# Patient Record
Sex: Female | Born: 1995 | Race: White | Hispanic: No | Marital: Single | Smoking: Never smoker
Health system: Southern US, Community
[De-identification: ages and names within clinical notes are randomized; demographics above are authoritative.]

## PROBLEM LIST (undated history)

## (undated) DIAGNOSIS — T7840XA Allergy, unspecified, initial encounter: Secondary | ICD-10-CM

## (undated) HISTORY — DX: Allergy, unspecified, initial encounter: T78.40XA

---
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
## 2017-07-21 IMAGING — US US ABDOMEN LIMITED
1 series · 14 of 25 positions shown · non-contrast
Comparison: None.

CLINICAL DATA: Upper abdominal pain with nausea and vomiting

EXAM:
ULTRASOUND ABDOMEN LIMITED RIGHT UPPER QUADRANT

[Series 1: us abdomen limited · 0.19mm/px · 14 of 42 slices shown]
[im 1/42]
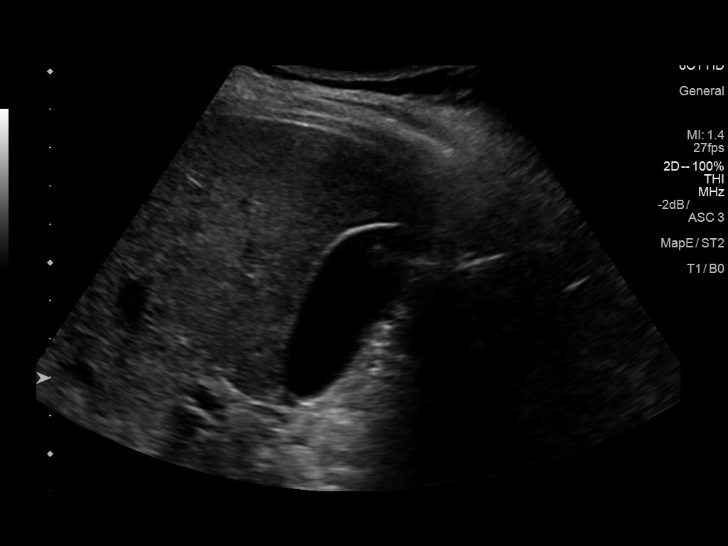
[im 4/42]
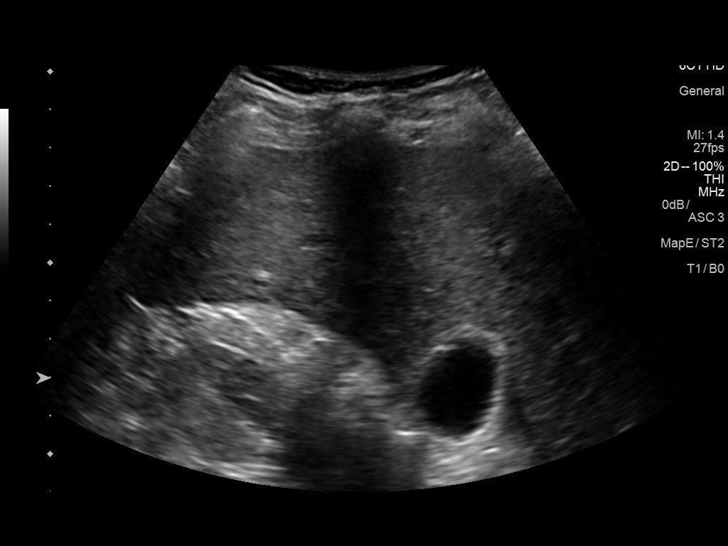
[im 7/42]
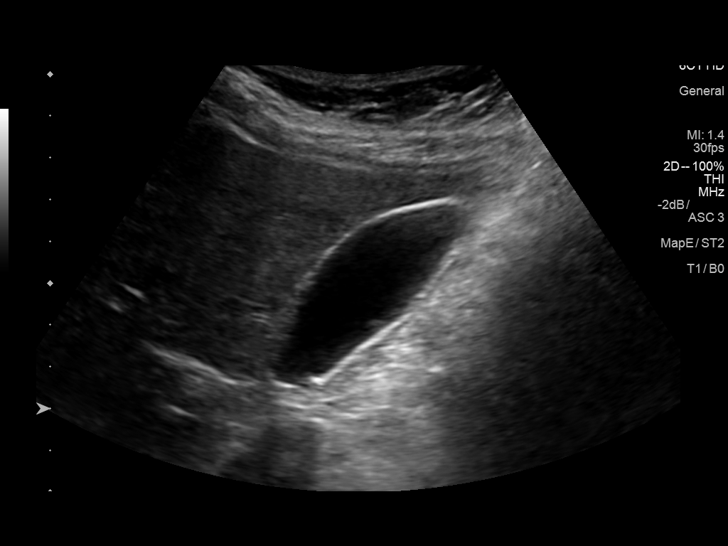
[im 11/42]
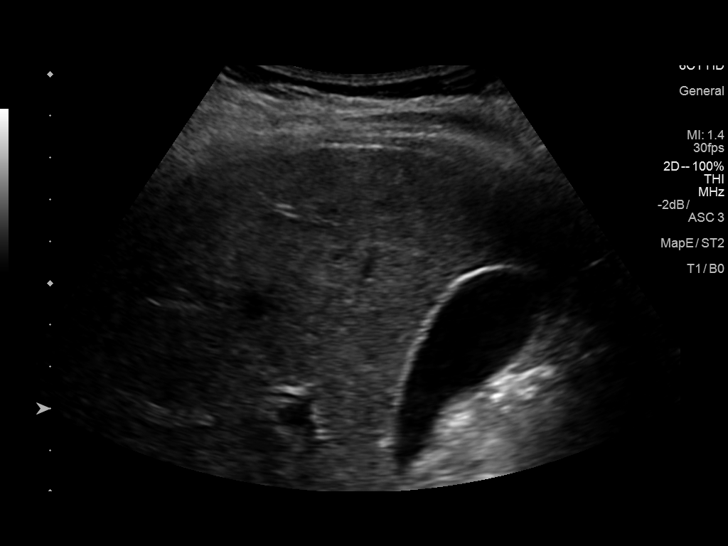
[im 14/42]
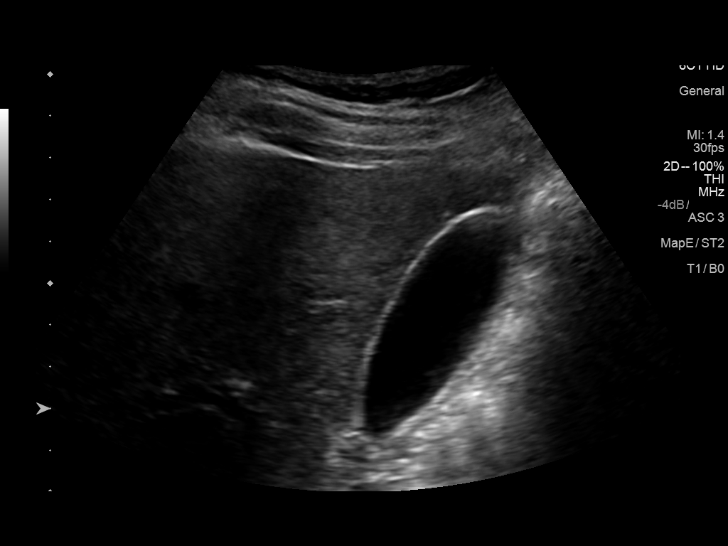
[im 16/42]
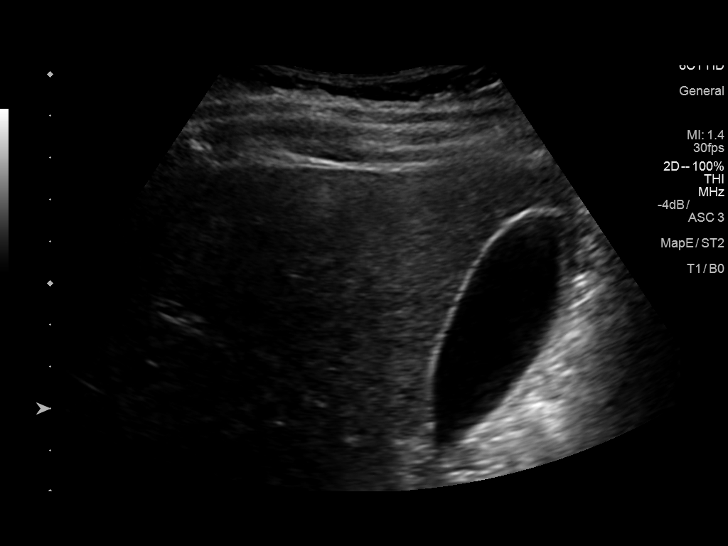
[im 19/42]
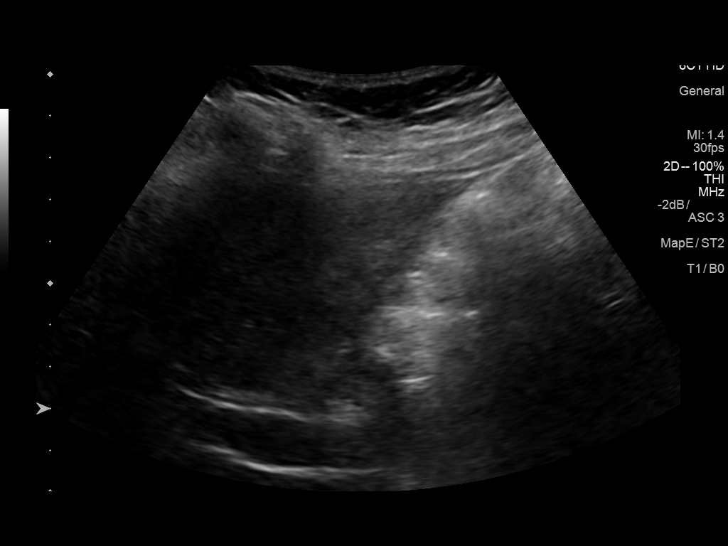
[im 23/42]
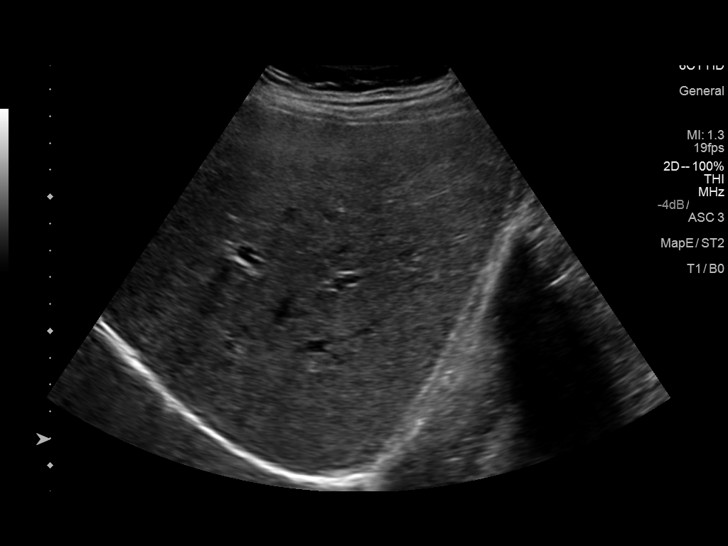
[im 26/42]
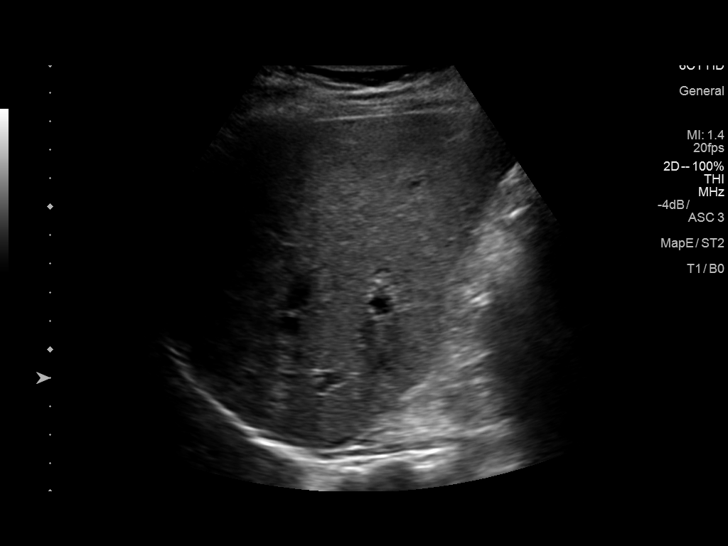
[im 28/42]
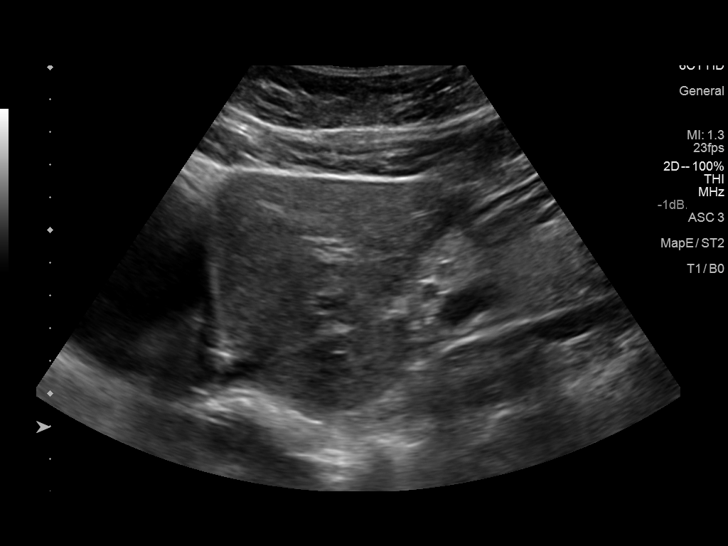
[im 31/42]
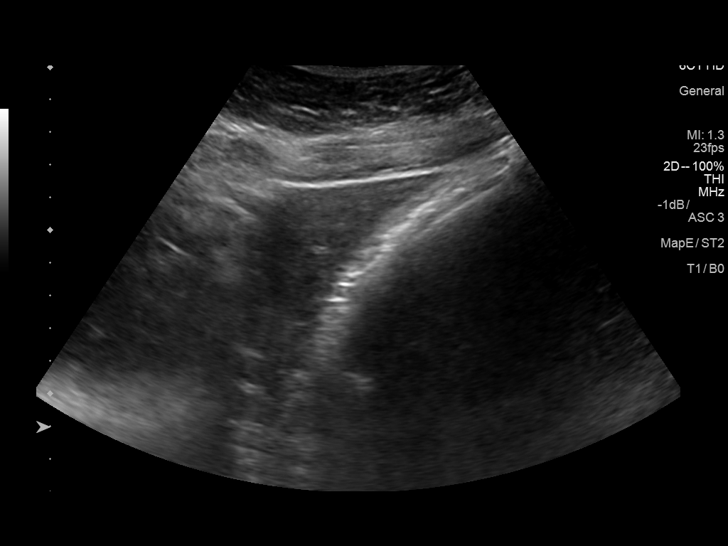
[im 35/42]
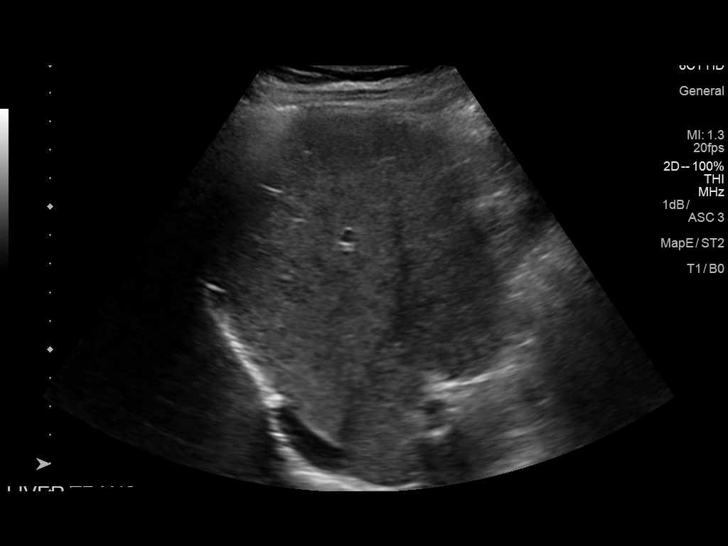
[im 38/42]
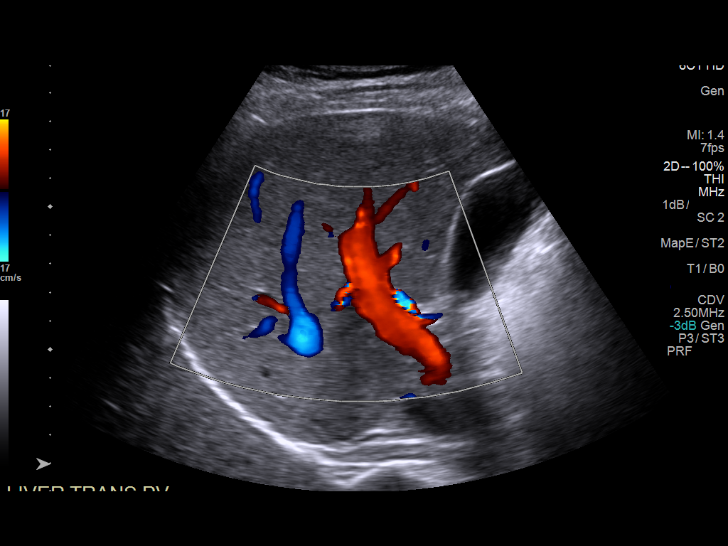
[im 42/42]
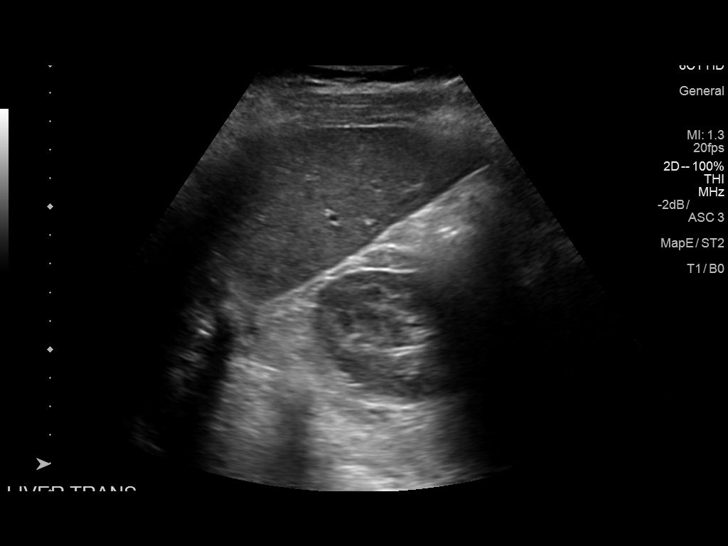

[14 of 25 positions shown; findings below may reference images not displayed]

FINDINGS: Gallbladder:

No gallstones or wall thickening visualized. There is no
pericholecystic fluid. No sonographic Murphy sign noted by
sonographer.

Common bile duct:

Diameter: 2 mm. No intrahepatic or extrahepatic biliary duct
dilatation.

Liver:

No focal lesion identified. Within normal limits in parenchymal
echogenicity. Portal vein is patent on color Doppler imaging with
normal direction of blood flow towards the liver.
IMPRESSION: Study within normal limits.

## 2018-06-26 ENCOUNTER — Other Ambulatory Visit: Payer: Self-pay | Admitting: Gastroenterology

## 2018-06-26 DIAGNOSIS — R112 Nausea with vomiting, unspecified: Secondary | ICD-10-CM

## 2018-07-05 ENCOUNTER — Ambulatory Visit
Admission: RE | Admit: 2018-07-05 | Discharge: 2018-07-05 | Disposition: A | Discharge status: Home / Self Care | Payer: BLUE CROSS/BLUE SHIELD | Source: Ambulatory Visit | Attending: Gastroenterology | Admitting: Gastroenterology

## 2018-07-05 DIAGNOSIS — R112 Nausea with vomiting, unspecified: Secondary | ICD-10-CM

## 2020-02-11 ENCOUNTER — Other Ambulatory Visit: Payer: Self-pay

## 2020-02-11 ENCOUNTER — Encounter: Payer: Self-pay | Admitting: Physician Assistant

## 2020-02-11 ENCOUNTER — Ambulatory Visit (INDEPENDENT_AMBULATORY_CARE_PROVIDER_SITE_OTHER): Payer: 59 | Admitting: Physician Assistant

## 2020-02-11 VITALS — BP 110/76 | HR 91 | Temp 97.6°F | Ht 62.0 in | Wt 222.0 lb

## 2020-02-11 DIAGNOSIS — L814 Other melanin hyperpigmentation: Secondary | ICD-10-CM | POA: Diagnosis not present

## 2020-02-11 DIAGNOSIS — L80 Vitiligo: Secondary | ICD-10-CM | POA: Diagnosis not present

## 2020-02-11 DIAGNOSIS — R239 Unspecified skin changes: Secondary | ICD-10-CM | POA: Diagnosis not present

## 2020-02-11 NOTE — Patient Instructions (Signed)
I will be in touch with your lab results when they have returned as well as my further recommendations.  Take care,  Jarold Motto PA-C

## 2020-02-11 NOTE — Progress Notes (Signed)
Shannon Day is a 24 y.o. female here to establish care.  I acted as a Neurosurgeon for [employer], PA-C Corky Mull, LPN  History of Present Illness:   Chief Complaint  Patient presents with  . Establish Care  . Blood work    Patient is here requesting lab work.  Approximately 4-5 months ago she was diagnosed with vitiligo. She was told she also has lentigo in her vitiligo and she was told that this is extremely rare. She has seen two dermatologists to get additional opinions. Her dermatologist has recommended that she get tested for lupus or another autoimmune disorder.  She also has a blistery rash on her chin that is currently being treated with topical steroids. This is improving - has been on this regimen for a few days.  Denies: unusual joint pain, fatigue, headaches, vision changes, joint redness/swelling, family history of autoimmune disorders, unintentional weight loss/gain  Current medications include OCP and omeprazole. She sees a gastroenterologist in [place], states that she has had negative work-up thus far, including two EGDs.  Patient's last menstrual period was 02/04/2020.   Depression screen [facility] 2/9 02/11/2020  Decreased Interest 0  Down, Depressed, Hopeless 0  PHQ - 2 Score 0    No flowsheet data found.   Other providers/specialists: Patient Care Team: Jarold Motto, [place] as PCP - General (Physician Assistant)   Past Medical History:  Diagnosis Date  . Allergy      Social History   Tobacco Use  . Smoking status: Never Smoker  . Smokeless tobacco: Never Used  Vaping Use  . Vaping Use: Never used  Substance Use Topics  . Alcohol use: Yes    Comment: one glass of wine  . Drug use: Never    Past Surgical History:  Procedure Laterality Date  . APPENDECTOMY  2003    History reviewed. No pertinent family history.  Not on File   Current Medications:   Current Outpatient Medications:  .  norgestrel-ethinyl estradiol (LO/OVRAL) 0.3-30  MG-MCG tablet, Take 1 tablet by mouth daily., Disp: , Rfl:  .  omeprazole (PRILOSEC) 20 MG capsule, Take 20 mg by mouth daily., Disp: , Rfl:    Review of Systems:   ROS  Negative unless otherwise specified per HPI.  Vitals:   Vitals:   02/11/20 0920  BP: 110/76  Pulse: 91  Temp: 97.6 F (36.4 C)  TempSrc: Temporal  SpO2: 96%  Weight: 222 lb (100.7 kg)  Height: 5\' 2"  (1.575 m)      Body mass index is 40.6 kg/m.  Physical Exam:   Physical Exam Vitals and nursing note reviewed.  Constitutional:      General: She is not in acute distress.    Appearance: She is well-developed. She is not ill-appearing or toxic-appearing.  Cardiovascular:     Rate and Rhythm: Normal rate and regular rhythm.     Pulses: Normal pulses.     Heart sounds: Normal heart sounds, S1 normal and S2 normal.     Comments: No LE edema Pulmonary:     Effort: Pulmonary effort is normal.     Breath sounds: Normal breath sounds.  Skin:    General: Skin is warm and dry.     Comments: Decreased spots of pigmentation on her forehead   Area of erythema with blistery appearance to her R chin area  Neurological:     Mental Status: She is alert.     GCS: GCS eye subscore is 4. GCS verbal subscore is 5.  GCS motor subscore is 6.  Psychiatric:        Speech: Speech normal.        Behavior: Behavior normal. Behavior is cooperative.     No results found for this or any previous visit.  Assessment and Plan:   Vedha was seen today for establish care and blood work.  Diagnoses and all orders for this visit:  Vitiligo; Lentigo; Recent skin changes Patient requesting lab work today to check for autoimmune disorders due to recent skin changes and per dermatology request. Discussed that typically in primary care setting, we will obtain a set of basic autoimmune labs and if any are positive, will refer to rheumatology to investigate further with more specialized/specific testing. Additionally, if testing is  negative, but patient continues to have concerns, we could also send to rheumatology for investigation, if they are agreeable to seeing the patient. Patient verbalized understanding to plan. Encouraged signing up for MyChart so she could review labs.  -     CBC with Differential/Platelet; Future -     Comprehensive metabolic panel; Future -     C-reactive protein; Future -     Cyclic citrul peptide antibody, IgG; Future -     Sedimentation rate; Future -     Rheumatoid Factor; Future -     ANA; Future -     Rheumatoid Factor -     Sedimentation rate -     ANA -     Cyclic citrul peptide antibody, IgG -     C-reactive protein -     Comprehensive metabolic panel -     CBC with Differential/Platelet  . Reviewed expectations re: course of current medical issues. . Discussed self-management of symptoms. . Outlined signs and symptoms indicating need for more acute intervention. . Patient verbalized understanding and all questions were answered. . See orders for this visit as documented in the electronic medical record. . Patient received an After-Visit Summary.  CMA or LPN served as scribe during this visit. History, Physical, and Plan performed by medical provider. The above documentation has been reviewed and is accurate and complete.  Jarold Motto, PA-C

## 2020-02-12 LAB — COMPREHENSIVE METABOLIC PANEL
AG Ratio: 1.6 (calc) (ref 1.0–2.5)
ALT: 21 U/L (ref 6–29)
AST: 15 U/L (ref 10–30)
Albumin: 3.9 g/dL (ref 3.6–5.1)
Alkaline phosphatase (APISO): 42 U/L (ref 31–125)
BUN: 17 mg/dL (ref 7–25)
CO2: 28 mmol/L (ref 20–32)
Calcium: 9 mg/dL (ref 8.6–10.2)
Chloride: 104 mmol/L (ref 98–110)
Creat: 0.88 mg/dL (ref 0.50–1.10)
Globulin: 2.4 g/dL (calc) (ref 1.9–3.7)
Glucose, Bld: 89 mg/dL (ref 65–99)
Potassium: 4.2 mmol/L (ref 3.5–5.3)
Sodium: 140 mmol/L (ref 135–146)
Total Bilirubin: 0.3 mg/dL (ref 0.2–1.2)
Total Protein: 6.3 g/dL (ref 6.1–8.1)

## 2020-02-12 LAB — C-REACTIVE PROTEIN: CRP: 5.3 mg/L (ref <8.0)

## 2020-02-12 LAB — CBC WITH DIFFERENTIAL/PLATELET
Absolute Monocytes: 560 cells/uL (ref 200–950)
Basophils Absolute: 40 cells/uL (ref 0–200)
Basophils Relative: 0.4 %
Eosinophils Absolute: 180 cells/uL (ref 15–500)
Eosinophils Relative: 1.8 %
HCT: 37.8 % (ref 35.0–45.0)
Hemoglobin: 12.5 g/dL (ref 11.7–15.5)
Lymphs Abs: 3860 cells/uL (ref 850–3900)
MCH: 29.9 pg (ref 27.0–33.0)
MCHC: 33.1 g/dL (ref 32.0–36.0)
MCV: 90.4 fL (ref 80.0–100.0)
MPV: 10 fL (ref 7.5–12.5)
Monocytes Relative: 5.6 %
Neutro Abs: 5360 cells/uL (ref 1500–7800)
Neutrophils Relative %: 53.6 %
Platelets: 298 10*3/uL (ref 140–400)
RBC: 4.18 10*6/uL (ref 3.80–5.10)
RDW: 13.1 % (ref 11.0–15.0)
Total Lymphocyte: 38.6 %
WBC: 10 10*3/uL (ref 3.8–10.8)

## 2020-02-12 LAB — ANA: Anti Nuclear Antibody (ANA): NEGATIVE

## 2020-02-12 LAB — SEDIMENTATION RATE: Sed Rate: 6 mm/h (ref 0–20)

## 2020-02-12 LAB — CYCLIC CITRUL PEPTIDE ANTIBODY, IGG: Cyclic Citrullin Peptide Ab: <16 UNITS

## 2020-02-12 LAB — RHEUMATOID FACTOR: Rheumatoid fact SerPl-aCnc: <14 IU/mL (ref <14)

## 2020-02-19 ENCOUNTER — Telehealth: Payer: Self-pay | Admitting: Physician Assistant

## 2020-02-19 DIAGNOSIS — R239 Unspecified skin changes: Secondary | ICD-10-CM

## 2020-02-19 DIAGNOSIS — M2550 Pain in unspecified joint: Secondary | ICD-10-CM

## 2020-02-19 DIAGNOSIS — R5383 Other fatigue: Secondary | ICD-10-CM

## 2020-02-19 NOTE — Telephone Encounter (Signed)
Shannon Day is calling in asking if she can get a referral to a rheumatologist for an possible auto immune disease, would like to go to [facility].

## 2020-02-20 NOTE — Telephone Encounter (Signed)
Left message on voicemail to call office. Referral has been placed and someone will contact her for an appt.

## 2022-07-21 IMAGING — MR MRI LEFT SHOULDER WITHOUT CONTRAST
5 of 6 series · 27 of 40 positions shown · IV contrast (gadolinium)
Comparison: 06/29/2022 [facility] radiographs

________________________________________________________________________________________________ 
MRI LEFT SHOULDER WITHOUT CONTRAST, 07/21/2022 [DATE]: 
CLINICAL INDICATION: Superior glenoid labrum lesion of left shoulder, initial 
encounter
TECHNIQUE: Multiplanar, multiecho position MR images of the shoulder were 
performed without intravenous gadolinium enhancement. Patient was scanned on a 
1.5T magnet.

[Series 201: survey left · axial · left · 10.0mm · 0.71mm/px · z∈[-40,+125]mm · 5 of 15 slices shown]
[im 1/15]
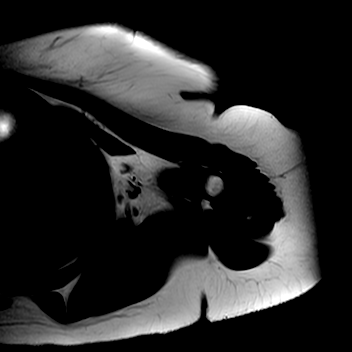
[im 4/15]
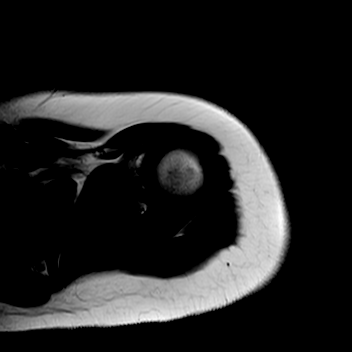
[im 8/15]
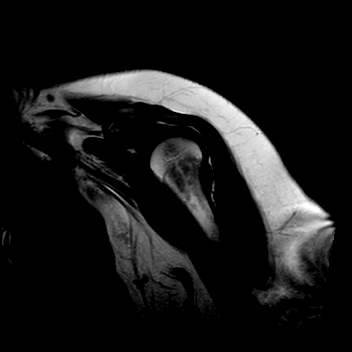
[im 11/15]
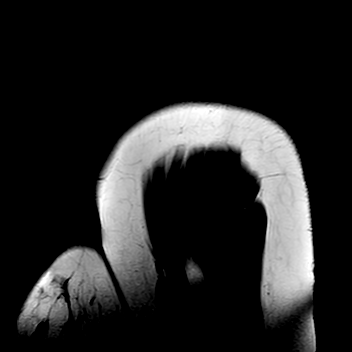
[im 15/15]
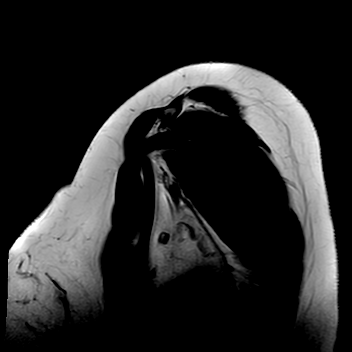

[Series 301: (person_name)_(person_name)_(person_name) · axial · left · 3.5mm · 0.42mm/px · z∈[-98,-3]mm · 7 of 28 slices shown]
[im 1/28]
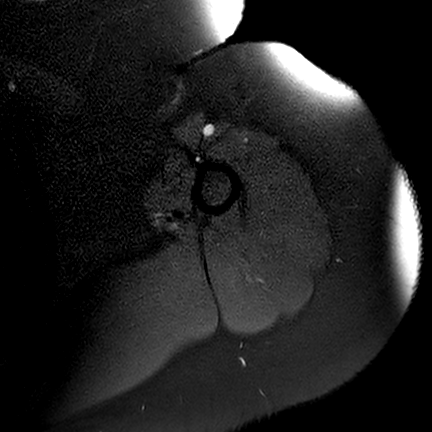
[im 5/28]
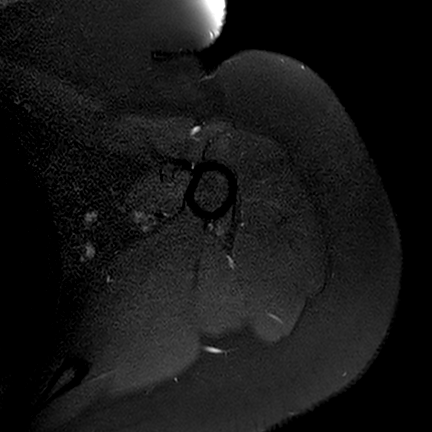
[im 10/28]
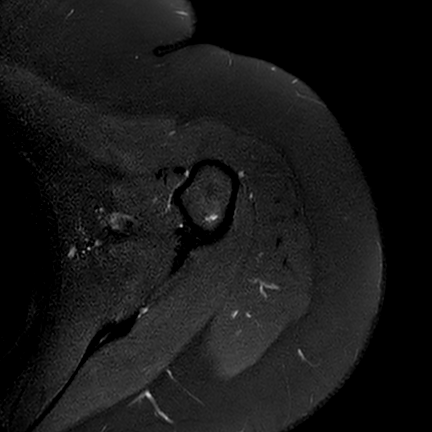
[im 14/28]
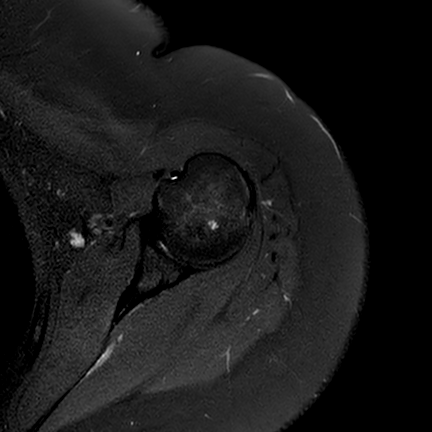
[im 19/28]
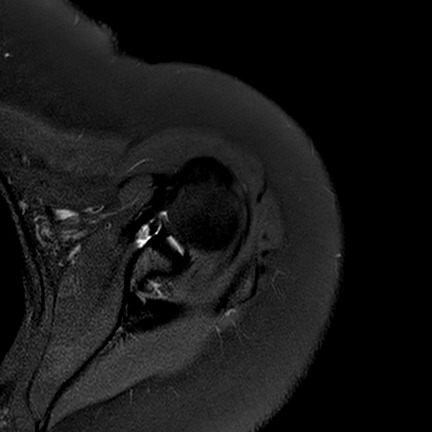
[im 23/28]
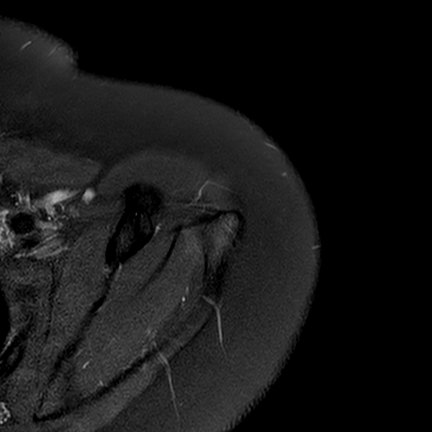
[im 28/28]
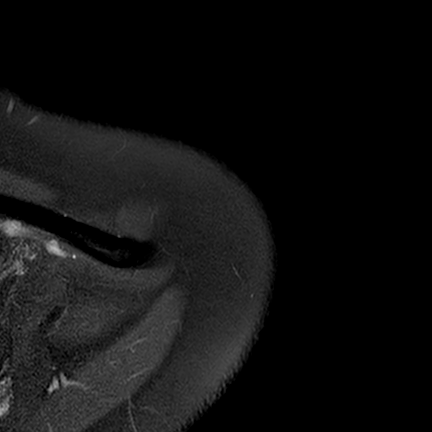

[Series 401: t2_fs_sag left · oblique · left · 3.5mm · 0.37mm/px · 8 of 30 slices shown]
[im 1/30]
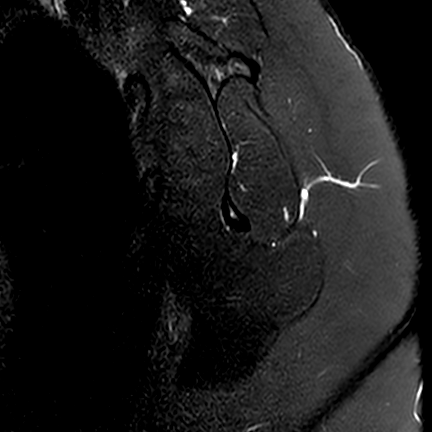
[im 5/30]
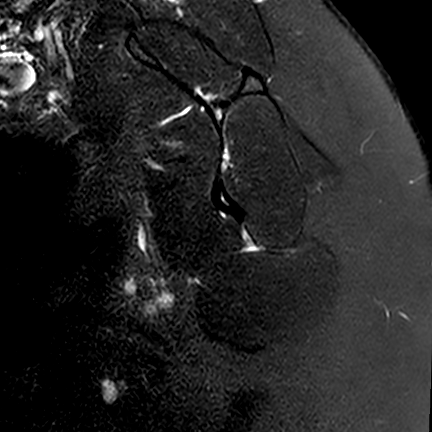
[im 9/30]
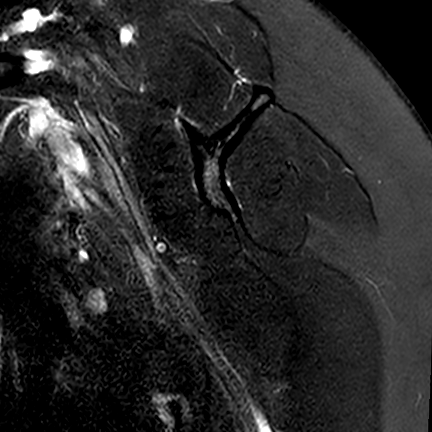
[im 13/30]
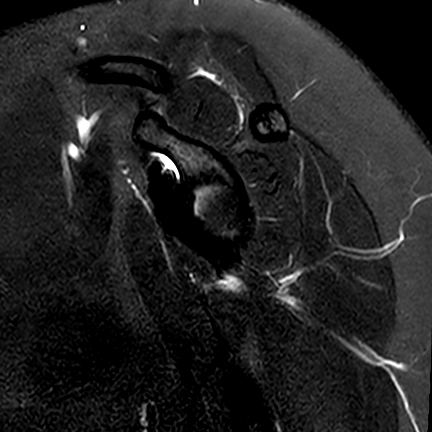
[im 17/30]
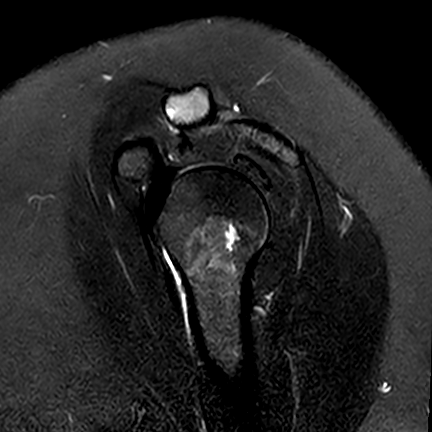
[im 21/30]
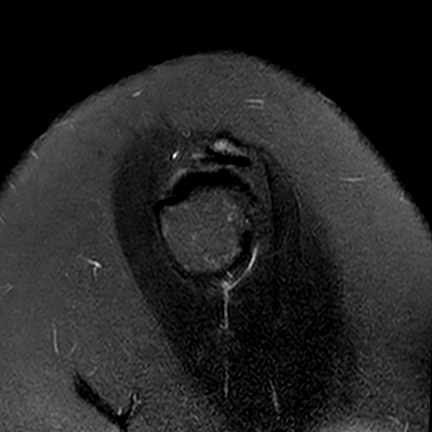
[im 25/30]
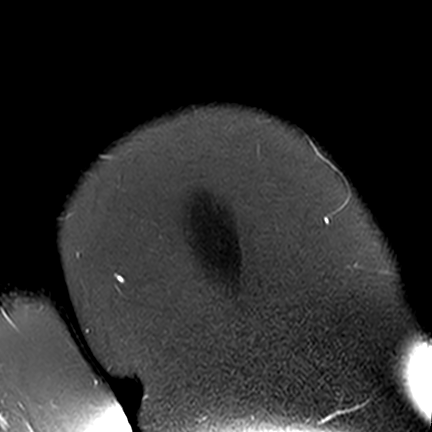
[im 30/30]
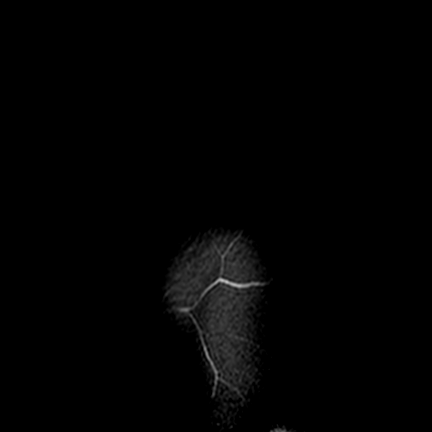

[Series 501: pd_fs_cor left · oblique · left · 3.5mm · 0.40mm/px · 6 of 24 slices shown]
[im 1/24]
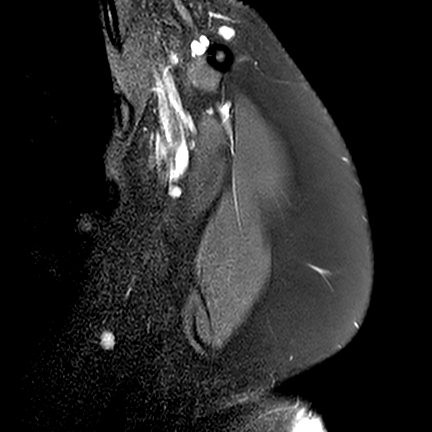
[im 5/24]
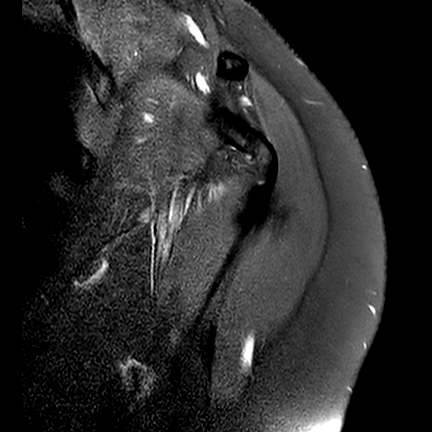
[im 10/24]
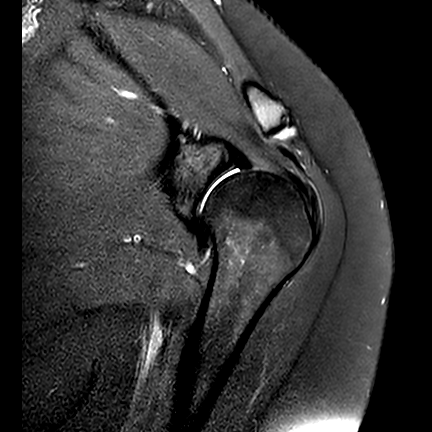
[im 14/24]
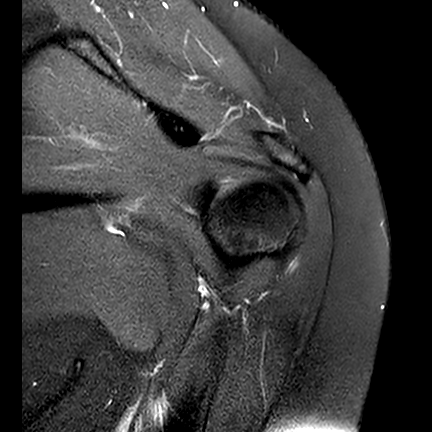
[im 19/24]
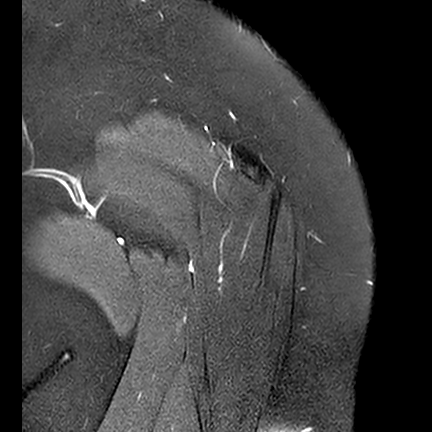
[im 24/24]
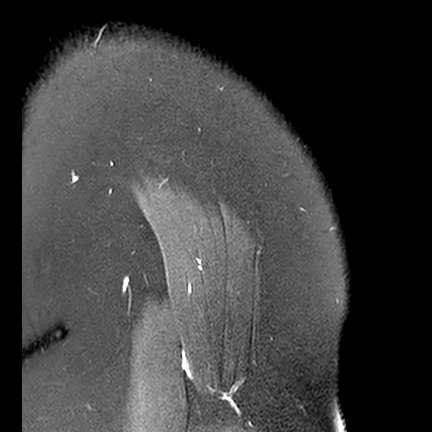

[Series 601: t1_sag left · oblique · left · 3.5mm · 0.31mm/px · 1 of 30 slices shown]
[im 1/30]
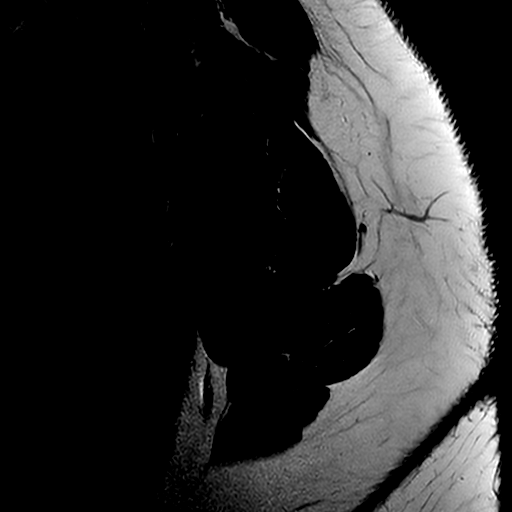

[27 of 40 positions shown; findings below may reference images not displayed]

FINDINGS: ROTATOR CUFF: The supraspinatus, infraspinatus, subscapularis and teres minor 
tendons are intact. The rotator cuff musculature is symmetric without mass, 
signal abnormality or atrophy. 
ACROMIOCLAVICULAR JOINT: AC joint is preserved. The coracoacromial ligament is 
intact without prominent spurring at the acromial attachment. The 
acromioclavicular and coracoclavicular ligaments are preserved. The acromium is 
normal in morphology. 
GLENOHUMERAL JOINT: The humeral head is well located within the glenoid fossa. 
Articular cartilage is preserved.  The glenoid labrum is preserved. No 
paralabral cyst. The intra-articular portion of the long head of the biceps 
tendon is negative. No shoulder joint effusion. 
BONES: Small subacute, nondisplaced oblique fracture through the distal 
clavicular superior margin with bone marrow edema in the distal 2 cm of the bone 
(sequence 501 image 11, sequence 301 image 3). No Hill-Sachs defect. Several 
small posterior humeral neck medullary cysts (up to 0.9 cm). Subcortical cystic 
change of the humeral head. 
ADDITIONAL FINDINGS: The axillary region is negative. Subcutaneous tissues are 
negative.
IMPRESSION: 1.  No superior labral lesion. 
2.  Small subacute, nondisplaced oblique fracture through the distal clavicular 
superior margin with bone marrow edema in the distal 2 cm of the bone.  
3.  Several small posterior humeral neck medullary cysts

## 2023-01-22 ENCOUNTER — Telehealth: Payer: Self-pay | Admitting: Physician Assistant

## 2023-01-22 NOTE — Telephone Encounter (Signed)
Called patient to schedule next available cpe, no answer left VM.

## 2023-06-29 IMAGING — DX CHEST PA AND LATERAL
3 series · 3 of 3 positions shown · non-contrast
Comparison: None.

________________________________________________________________________________________________ 
CLINICAL INDICATION: Hemoptysis.

[PA (1 of 2)]
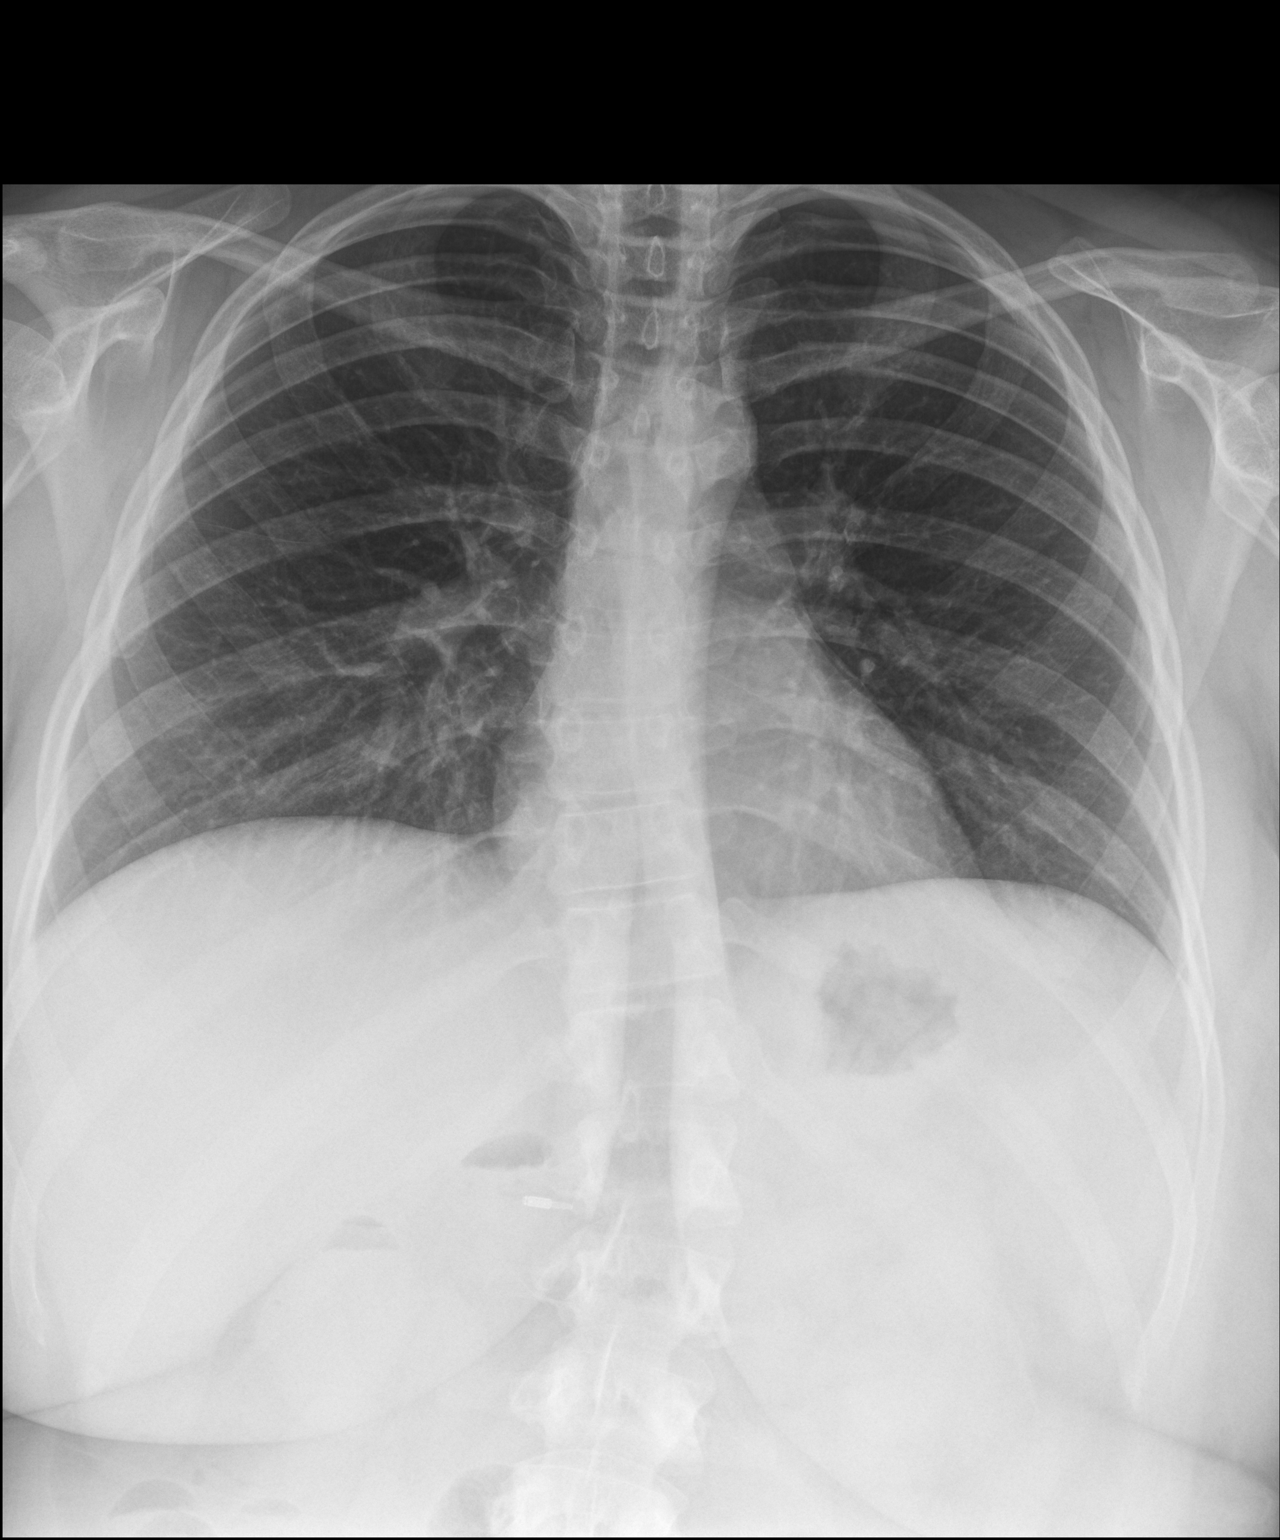

[lateral]
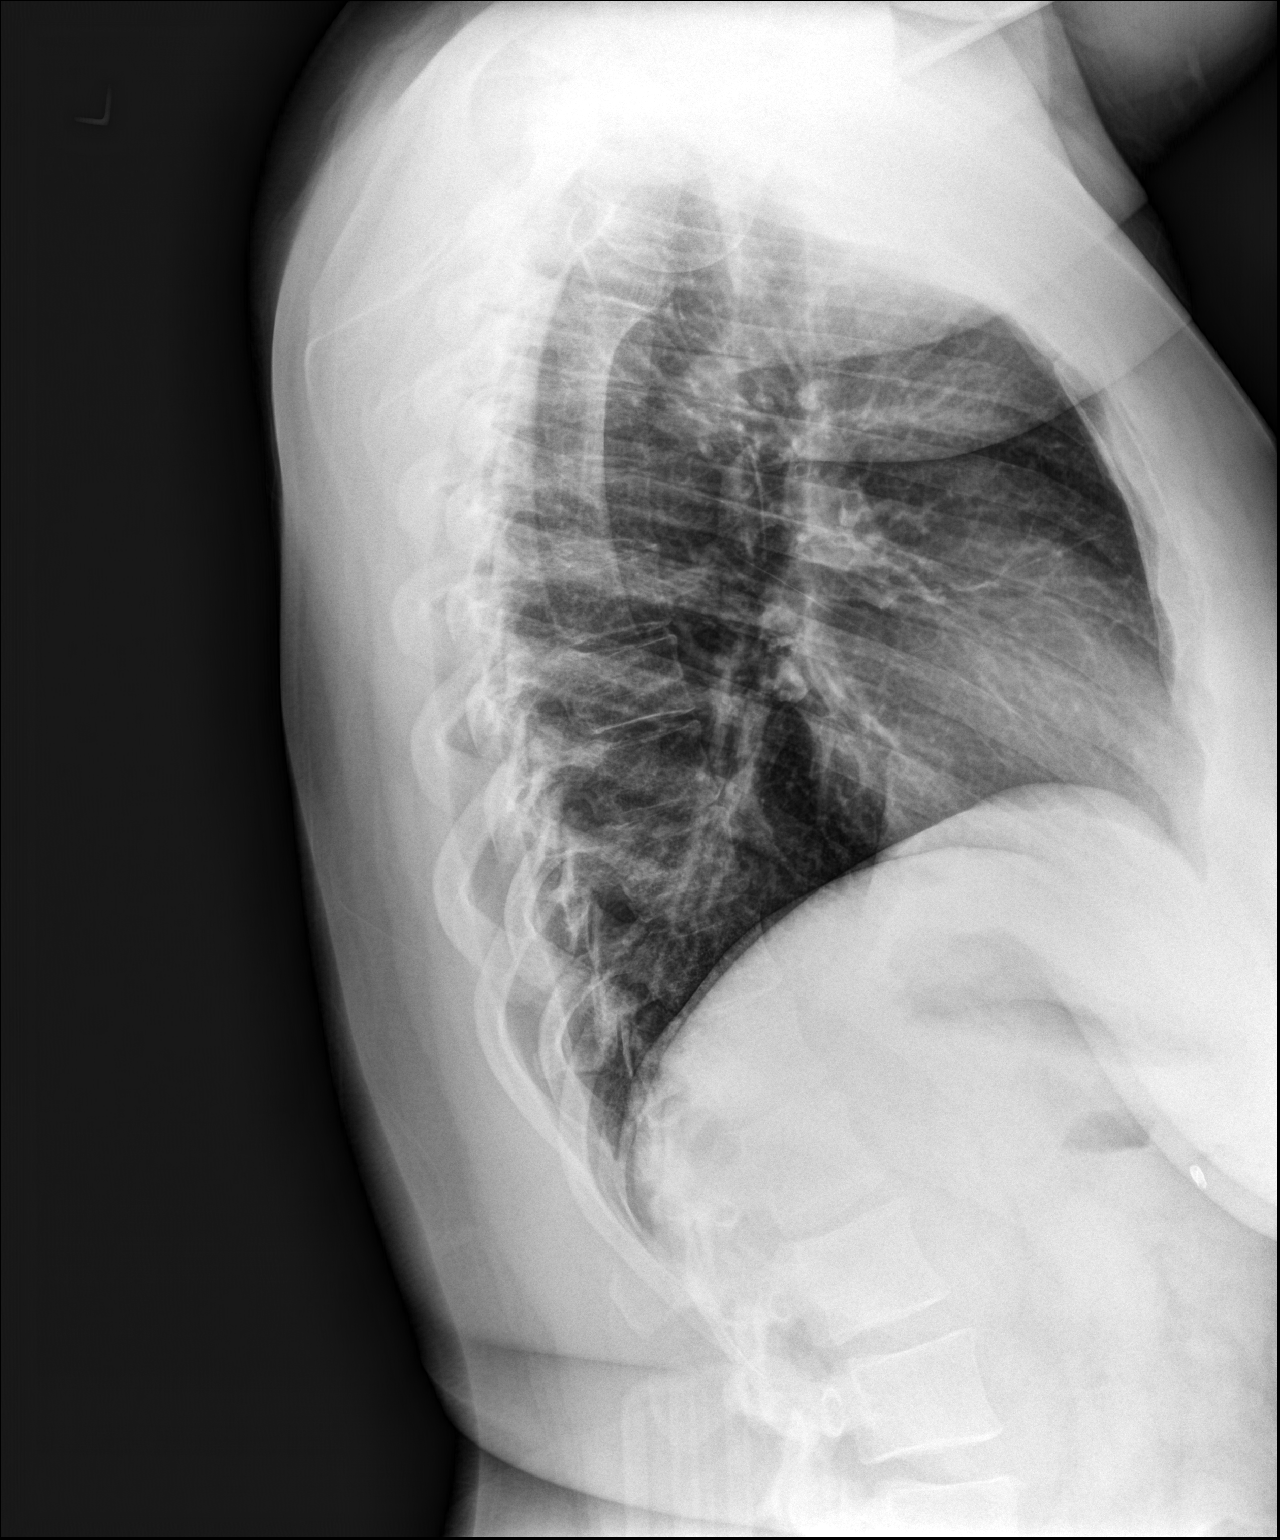

[PA (2 of 2)]
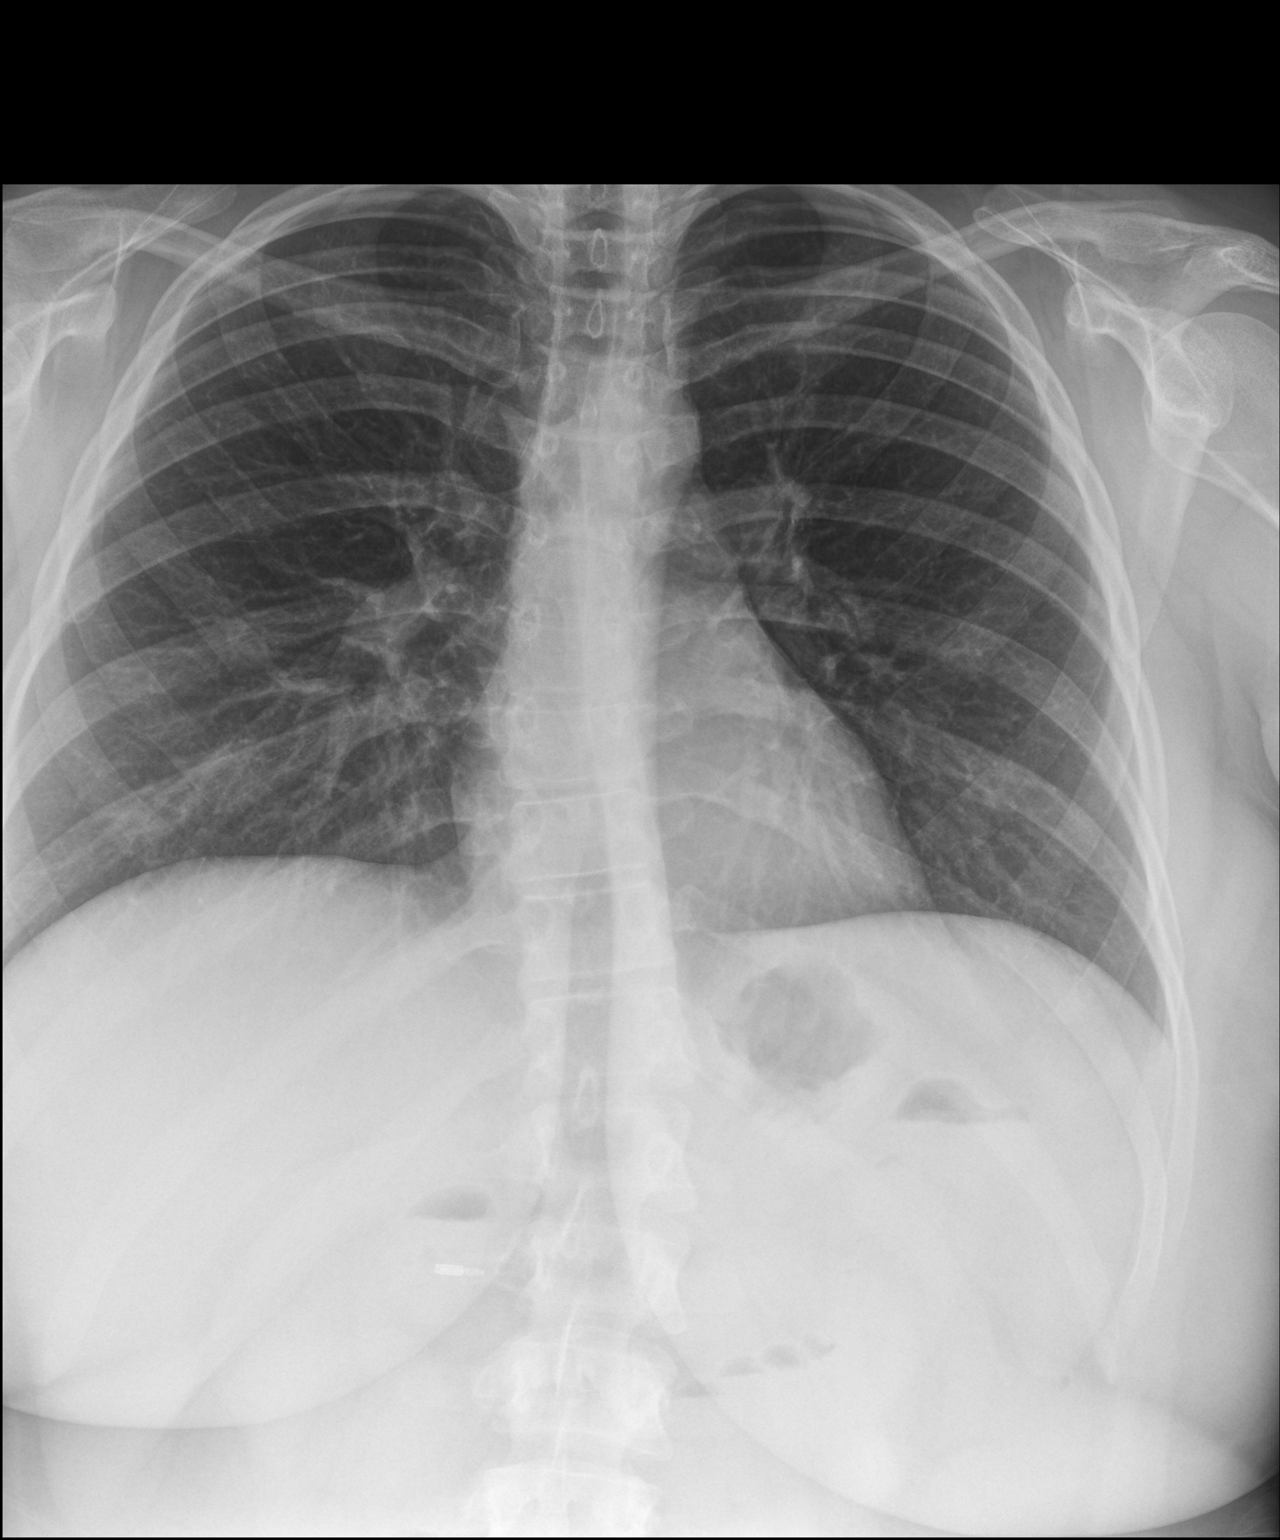

[3 of 3 positions shown; findings below may reference images not displayed]

FINDINGS: No consolidation. No effusion. Normal cardiac size and pulmonary 
vascularity. No pneumothorax. No acute osseous abnormality. GI clip in the 
abdomen.
IMPRESSION: No acute cardiopulmonary findings.
